# Patient Record
Sex: Male | Born: 1980 | Race: White | Hispanic: No | Marital: Single | Smoking: Never smoker
Health system: Southern US, Community
[De-identification: ages and names within clinical notes are randomized; demographics above are authoritative.]

## PROBLEM LIST (undated history)

## (undated) DIAGNOSIS — Z9221 Personal history of antineoplastic chemotherapy: Secondary | ICD-10-CM

## (undated) DIAGNOSIS — Z87898 Personal history of other specified conditions: Secondary | ICD-10-CM

## (undated) DIAGNOSIS — C419 Malignant neoplasm of bone and articular cartilage, unspecified: Secondary | ICD-10-CM

## (undated) HISTORY — PX: PORTACATH PLACEMENT: SHX2246

## (undated) HISTORY — DX: Personal history of other specified conditions: Z87.898

## (undated) HISTORY — PX: OTHER SURGICAL HISTORY: SHX169

## (undated) HISTORY — DX: Malignant neoplasm of bone and articular cartilage, unspecified: C41.9

## (undated) HISTORY — DX: Personal history of antineoplastic chemotherapy: Z92.21

## (undated) HISTORY — PX: KNEE SURGERY: SHX244

---

## 2004-04-19 ENCOUNTER — Ambulatory Visit: Payer: Self-pay | Admitting: Specialist

## 2005-01-22 ENCOUNTER — Ambulatory Visit: Payer: Self-pay | Admitting: Specialist

## 2006-07-14 ENCOUNTER — Ambulatory Visit: Payer: Self-pay | Admitting: General Practice

## 2006-08-24 ENCOUNTER — Ambulatory Visit: Payer: Self-pay | Admitting: Internal Medicine

## 2006-09-12 ENCOUNTER — Ambulatory Visit: Payer: Self-pay | Admitting: General Surgery

## 2006-09-15 ENCOUNTER — Ambulatory Visit: Payer: Self-pay | Admitting: Internal Medicine

## 2006-09-19 ENCOUNTER — Ambulatory Visit: Payer: Self-pay | Admitting: Internal Medicine

## 2006-09-23 ENCOUNTER — Ambulatory Visit: Payer: Self-pay | Admitting: Internal Medicine

## 2006-10-24 ENCOUNTER — Ambulatory Visit: Payer: Self-pay | Admitting: Internal Medicine

## 2006-11-02 ENCOUNTER — Other Ambulatory Visit: Payer: Self-pay

## 2006-11-02 ENCOUNTER — Emergency Department: Payer: Self-pay | Admitting: Emergency Medicine

## 2006-11-04 ENCOUNTER — Inpatient Hospital Stay: Payer: Self-pay | Admitting: Oncology

## 2006-11-11 ENCOUNTER — Inpatient Hospital Stay: Payer: Self-pay | Admitting: Internal Medicine

## 2006-11-24 ENCOUNTER — Ambulatory Visit: Payer: Self-pay | Admitting: Internal Medicine

## 2006-12-04 ENCOUNTER — Inpatient Hospital Stay: Payer: Self-pay | Admitting: Internal Medicine

## 2006-12-24 ENCOUNTER — Ambulatory Visit: Payer: Self-pay | Admitting: Internal Medicine

## 2006-12-26 ENCOUNTER — Inpatient Hospital Stay: Payer: Self-pay | Admitting: Internal Medicine

## 2007-01-24 ENCOUNTER — Ambulatory Visit: Payer: Self-pay | Admitting: Internal Medicine

## 2007-02-05 ENCOUNTER — Inpatient Hospital Stay: Payer: Self-pay | Admitting: Internal Medicine

## 2007-02-23 ENCOUNTER — Ambulatory Visit: Payer: Self-pay | Admitting: Internal Medicine

## 2007-02-27 ENCOUNTER — Inpatient Hospital Stay: Payer: Self-pay | Admitting: Internal Medicine

## 2007-03-19 ENCOUNTER — Inpatient Hospital Stay: Payer: Self-pay | Admitting: Internal Medicine

## 2007-03-26 ENCOUNTER — Ambulatory Visit: Payer: Self-pay | Admitting: Internal Medicine

## 2007-04-10 ENCOUNTER — Inpatient Hospital Stay: Payer: Self-pay | Admitting: Oncology

## 2007-04-18 ENCOUNTER — Observation Stay: Payer: Self-pay | Admitting: Internal Medicine

## 2007-04-26 ENCOUNTER — Ambulatory Visit: Payer: Self-pay | Admitting: Internal Medicine

## 2007-04-30 ENCOUNTER — Inpatient Hospital Stay: Payer: Self-pay | Admitting: Internal Medicine

## 2007-05-16 ENCOUNTER — Observation Stay: Payer: Self-pay | Admitting: Internal Medicine

## 2007-05-24 ENCOUNTER — Ambulatory Visit: Payer: Self-pay | Admitting: Internal Medicine

## 2007-05-26 ENCOUNTER — Inpatient Hospital Stay: Payer: Self-pay | Admitting: Internal Medicine

## 2007-06-15 ENCOUNTER — Inpatient Hospital Stay: Payer: Self-pay | Admitting: Internal Medicine

## 2007-06-24 ENCOUNTER — Ambulatory Visit: Payer: Self-pay | Admitting: Internal Medicine

## 2007-06-27 ENCOUNTER — Observation Stay: Payer: Self-pay | Admitting: Internal Medicine

## 2007-06-28 ENCOUNTER — Observation Stay: Payer: Self-pay | Admitting: Internal Medicine

## 2007-06-30 ENCOUNTER — Inpatient Hospital Stay: Payer: Self-pay | Admitting: Internal Medicine

## 2007-07-07 ENCOUNTER — Inpatient Hospital Stay: Payer: Self-pay | Admitting: Internal Medicine

## 2007-07-24 ENCOUNTER — Ambulatory Visit: Payer: Self-pay | Admitting: Internal Medicine

## 2007-07-27 ENCOUNTER — Inpatient Hospital Stay: Payer: Self-pay | Admitting: Internal Medicine

## 2007-08-08 ENCOUNTER — Observation Stay: Payer: Self-pay | Admitting: Internal Medicine

## 2007-08-18 ENCOUNTER — Inpatient Hospital Stay: Payer: Self-pay | Admitting: Internal Medicine

## 2007-08-24 ENCOUNTER — Ambulatory Visit: Payer: Self-pay | Admitting: Internal Medicine

## 2007-09-07 ENCOUNTER — Inpatient Hospital Stay: Payer: Self-pay | Admitting: Internal Medicine

## 2007-09-19 ENCOUNTER — Observation Stay: Payer: Self-pay | Admitting: Oncology

## 2007-09-23 ENCOUNTER — Ambulatory Visit: Payer: Self-pay | Admitting: Internal Medicine

## 2007-09-29 ENCOUNTER — Inpatient Hospital Stay: Payer: Self-pay | Admitting: Internal Medicine

## 2007-10-19 ENCOUNTER — Inpatient Hospital Stay: Payer: Self-pay | Admitting: Internal Medicine

## 2007-10-24 ENCOUNTER — Ambulatory Visit: Payer: Self-pay | Admitting: Internal Medicine

## 2007-10-31 ENCOUNTER — Observation Stay: Payer: Self-pay | Admitting: Oncology

## 2007-11-24 ENCOUNTER — Ambulatory Visit: Payer: Self-pay | Admitting: Internal Medicine

## 2007-12-15 ENCOUNTER — Ambulatory Visit: Payer: Self-pay | Admitting: Internal Medicine

## 2007-12-24 ENCOUNTER — Ambulatory Visit: Payer: Self-pay | Admitting: Internal Medicine

## 2008-01-24 ENCOUNTER — Ambulatory Visit: Payer: Self-pay | Admitting: Internal Medicine

## 2008-02-22 ENCOUNTER — Ambulatory Visit: Payer: Self-pay | Admitting: Internal Medicine

## 2008-02-23 ENCOUNTER — Ambulatory Visit: Payer: Self-pay | Admitting: Internal Medicine

## 2008-02-29 ENCOUNTER — Ambulatory Visit: Payer: Self-pay | Admitting: Internal Medicine

## 2008-03-12 ENCOUNTER — Emergency Department: Payer: Self-pay | Admitting: Emergency Medicine

## 2008-04-25 ENCOUNTER — Ambulatory Visit: Payer: Self-pay | Admitting: Internal Medicine

## 2008-05-12 ENCOUNTER — Ambulatory Visit: Payer: Self-pay | Admitting: Internal Medicine

## 2008-05-23 ENCOUNTER — Ambulatory Visit: Payer: Self-pay | Admitting: Internal Medicine

## 2008-06-23 ENCOUNTER — Ambulatory Visit: Payer: Self-pay | Admitting: Internal Medicine

## 2008-07-21 ENCOUNTER — Ambulatory Visit: Payer: Self-pay | Admitting: Internal Medicine

## 2008-07-23 ENCOUNTER — Ambulatory Visit: Payer: Self-pay | Admitting: Internal Medicine

## 2008-08-23 ENCOUNTER — Ambulatory Visit: Payer: Self-pay | Admitting: Internal Medicine

## 2008-09-20 ENCOUNTER — Ambulatory Visit: Payer: Self-pay | Admitting: Internal Medicine

## 2008-09-22 ENCOUNTER — Ambulatory Visit: Payer: Self-pay | Admitting: Internal Medicine

## 2008-11-23 ENCOUNTER — Ambulatory Visit: Payer: Self-pay | Admitting: Internal Medicine

## 2008-12-01 ENCOUNTER — Ambulatory Visit: Payer: Self-pay | Admitting: Internal Medicine

## 2008-12-23 ENCOUNTER — Ambulatory Visit: Payer: Self-pay | Admitting: Internal Medicine

## 2009-03-09 IMAGING — CR DG CHEST 2V
1 series · 2 of 2 positions shown · non-contrast
Comparison: none

REASON FOR EXAM: Dinorah Sarcoma
COMMENTS:

[Series 1: view not recorded · 0.17mm/px · 2 of 2 slices shown]
[im 1/2]
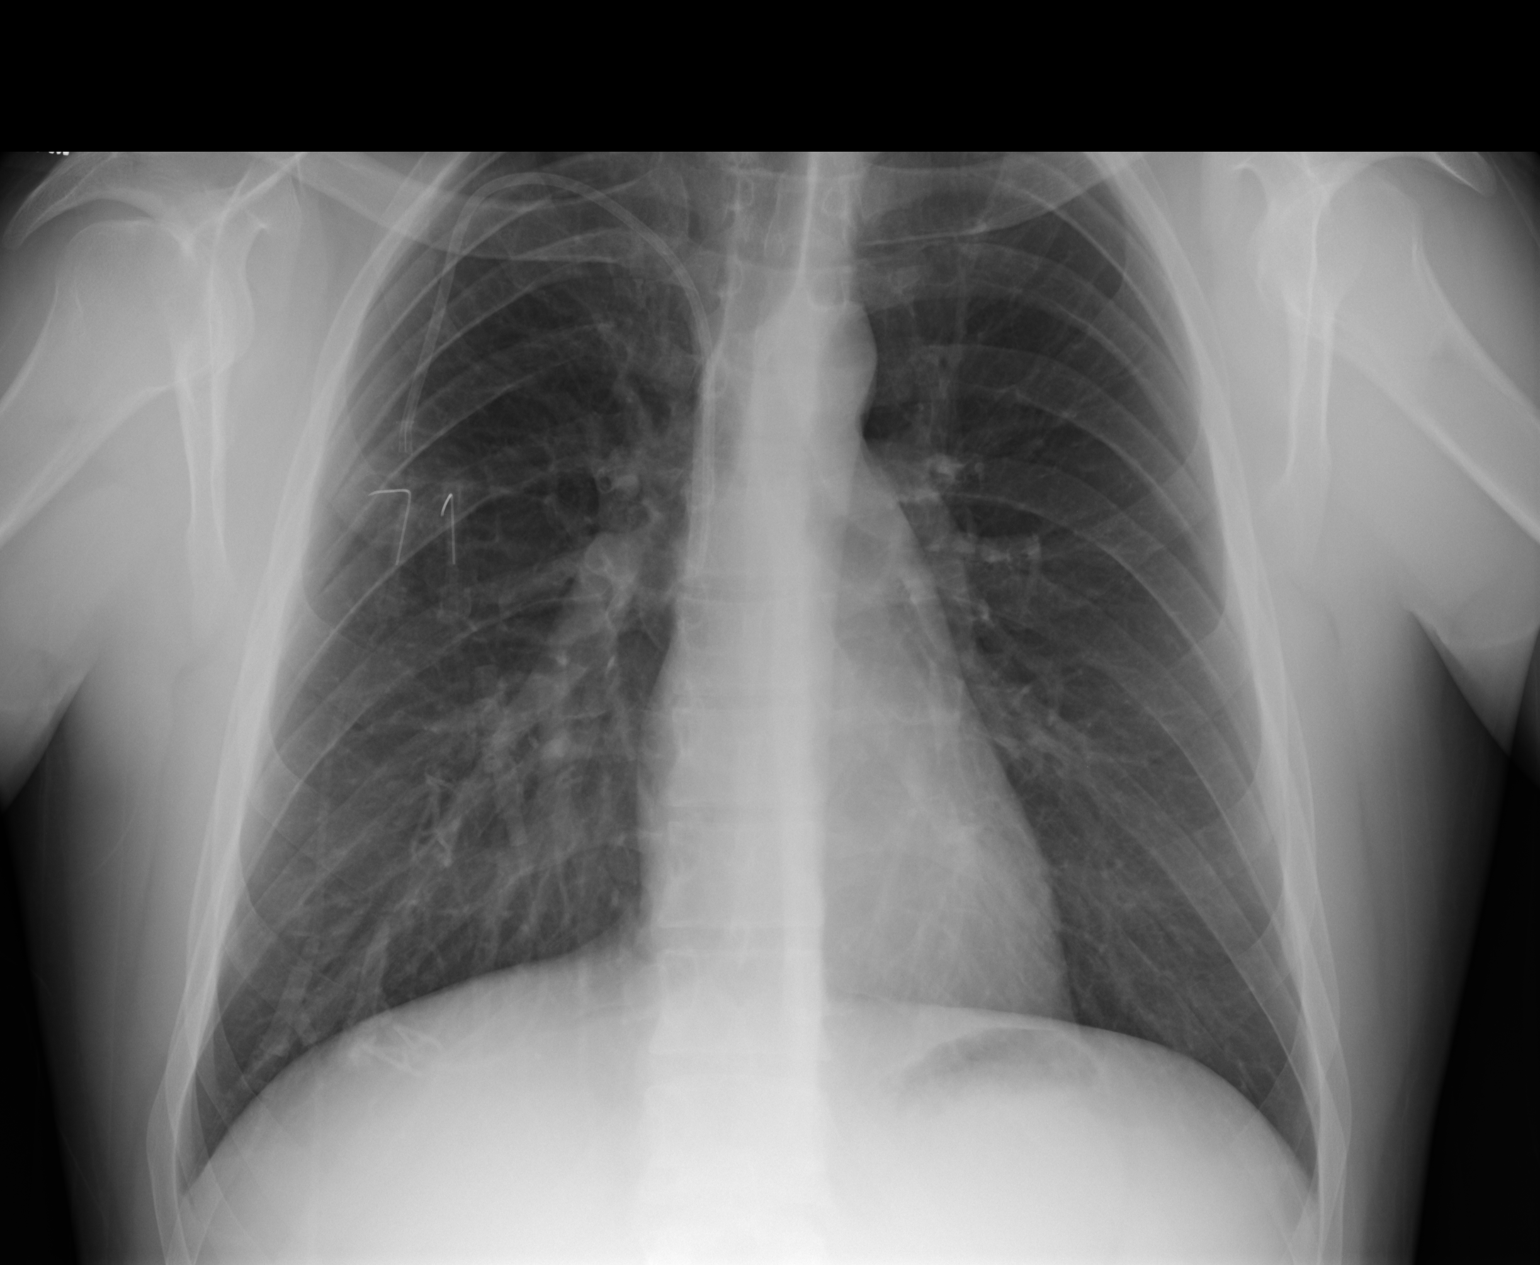
[im 2/2]
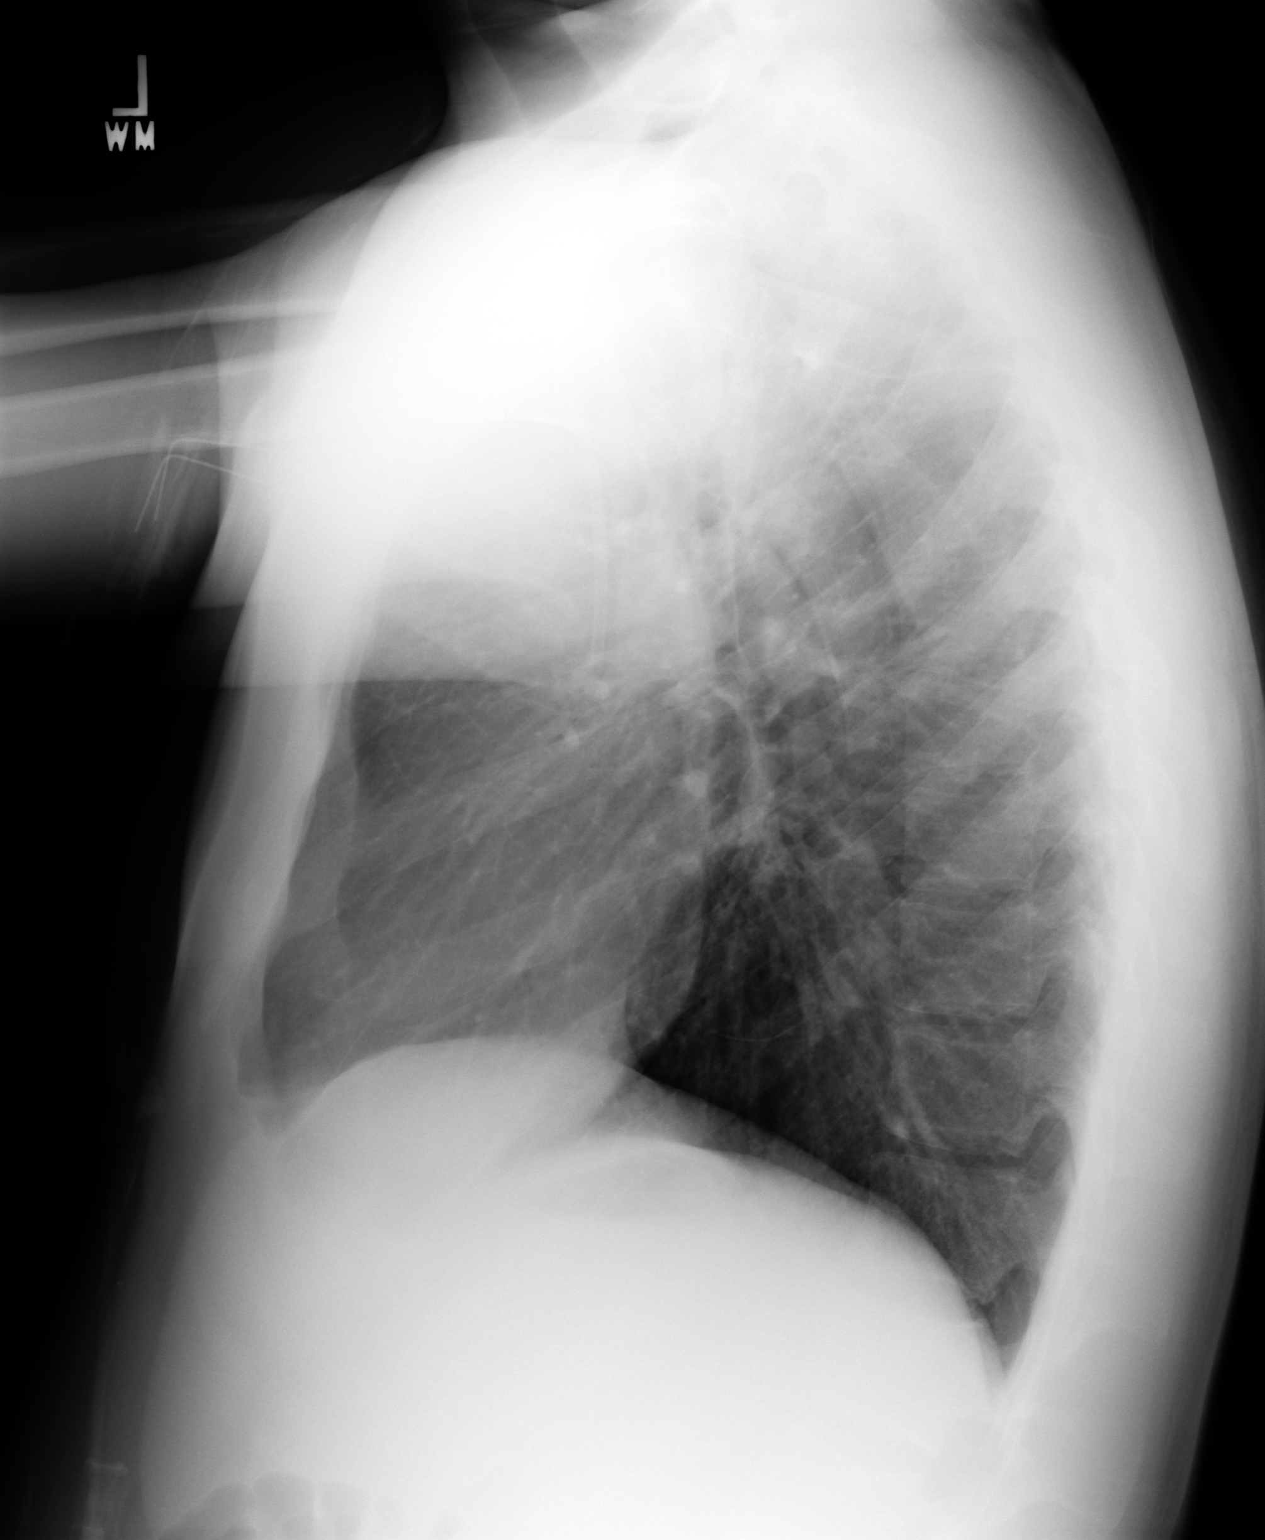

[2 of 2 positions shown; findings below may reference images not displayed]

PROCEDURE:     DXR - DXR CHEST PA (OR AP) AND LATERAL  - February 05, 2007  [DATE]

RESULT:     The lungs are mildly hyperinflated but clear. There is an
indwelling venous catheter in place with the tip of the catheter lying in
the region of the distal SVC. The heart and mediastinal structures are
normal in appearance.
IMPRESSION: I do not see findings suspicious for metastatic disease to the lungs. No
acute cardiopulmonary abnormality is identified.

## 2009-04-25 ENCOUNTER — Ambulatory Visit: Payer: Self-pay | Admitting: Internal Medicine

## 2009-05-12 ENCOUNTER — Ambulatory Visit: Payer: Self-pay | Admitting: Internal Medicine

## 2009-05-23 ENCOUNTER — Ambulatory Visit: Payer: Self-pay | Admitting: Internal Medicine

## 2009-06-23 ENCOUNTER — Ambulatory Visit: Payer: Self-pay | Admitting: Internal Medicine

## 2009-11-23 ENCOUNTER — Ambulatory Visit: Payer: Self-pay | Admitting: Internal Medicine

## 2009-12-14 ENCOUNTER — Ambulatory Visit: Payer: Self-pay | Admitting: Internal Medicine

## 2009-12-23 ENCOUNTER — Ambulatory Visit: Payer: Self-pay | Admitting: Internal Medicine

## 2010-06-20 ENCOUNTER — Ambulatory Visit: Payer: Self-pay | Admitting: Internal Medicine

## 2010-06-24 ENCOUNTER — Ambulatory Visit: Payer: Self-pay | Admitting: Internal Medicine

## 2010-07-24 ENCOUNTER — Ambulatory Visit: Payer: Self-pay | Admitting: Internal Medicine

## 2011-02-04 ENCOUNTER — Ambulatory Visit: Payer: Self-pay | Admitting: Internal Medicine

## 2011-02-23 ENCOUNTER — Ambulatory Visit: Payer: Self-pay | Admitting: Internal Medicine

## 2011-03-26 HISTORY — PX: TONSILLECTOMY: SUR1361

## 2011-08-07 ENCOUNTER — Ambulatory Visit: Payer: Self-pay | Admitting: Internal Medicine

## 2011-08-07 LAB — CBC CANCER CENTER
Basophil %: 0.5 %
Eosinophil %: 1.4 %
HCT: 45.6 % (ref 40.0–52.0)
HGB: 15.5 g/dL (ref 13.0–18.0)
Lymphocyte %: 29.3 %
MCH: 31.8 pg (ref 26.0–34.0)
Monocyte #: 0.3 x10 3/mm (ref 0.2–1.0)
Monocyte %: 10.3 %
Neutrophil %: 58.5 %
Platelet: 118 x10 3/mm — ABNORMAL LOW (ref 150–440)
RBC: 4.86 10*6/uL (ref 4.40–5.90)
RDW: 12.4 % (ref 11.5–14.5)

## 2011-08-07 LAB — CREATININE, SERUM: Creatinine: 1.33 mg/dL — ABNORMAL HIGH (ref 0.60–1.30)

## 2011-08-07 LAB — HEPATIC FUNCTION PANEL A (ARMC)
Albumin: 4.3 g/dL (ref 3.4–5.0)
Alkaline Phosphatase: 80 U/L (ref 50–136)
SGOT(AST): 27 U/L (ref 15–37)
SGPT (ALT): 29 U/L
Total Protein: 7.1 g/dL (ref 6.4–8.2)

## 2011-08-07 LAB — LACTATE DEHYDROGENASE: LDH: 127 U/L (ref 87–241)

## 2011-08-24 ENCOUNTER — Ambulatory Visit: Payer: Self-pay | Admitting: Internal Medicine

## 2012-03-06 ENCOUNTER — Ambulatory Visit: Payer: Self-pay | Admitting: Unknown Physician Specialty

## 2012-12-10 ENCOUNTER — Ambulatory Visit: Payer: Self-pay | Admitting: General Practice

## 2013-02-01 ENCOUNTER — Encounter: Payer: Self-pay | Admitting: *Deleted

## 2013-02-03 ENCOUNTER — Ambulatory Visit: Payer: Self-pay | Admitting: Internal Medicine

## 2013-02-03 LAB — COMPREHENSIVE METABOLIC PANEL
Albumin: 4.3 g/dL (ref 3.4–5.0)
Anion Gap: 10 (ref 7–16)
BUN: 17 mg/dL (ref 7–18)
Chloride: 103 mmol/L (ref 98–107)
Creatinine: 1.38 mg/dL — ABNORMAL HIGH (ref 0.60–1.30)
EGFR (African American): >60
Osmolality: 283 (ref 275–301)
SGOT(AST): 17 U/L (ref 15–37)
SGPT (ALT): 23 U/L (ref 12–78)
Sodium: 141 mmol/L (ref 136–145)
Total Protein: 6.8 g/dL (ref 6.4–8.2)

## 2013-02-03 LAB — CBC CANCER CENTER
Basophil #: 0 x10 3/mm (ref 0.0–0.1)
Basophil %: 0.5 %
Eosinophil #: 0.1 x10 3/mm (ref 0.0–0.7)
Eosinophil %: 1.2 %
HCT: 44.6 % (ref 40.0–52.0)
HGB: 15.3 g/dL (ref 13.0–18.0)
Lymphocyte #: 1.2 x10 3/mm (ref 1.0–3.6)
Lymphocyte %: 27.7 %
Monocyte %: 8.8 %
Neutrophil #: 2.7 x10 3/mm (ref 1.4–6.5)
Neutrophil %: 61.8 %
Platelet: 132 x10 3/mm — ABNORMAL LOW (ref 150–440)
RDW: 12.1 % (ref 11.5–14.5)

## 2013-02-22 ENCOUNTER — Ambulatory Visit: Payer: Self-pay | Admitting: Internal Medicine

## 2013-02-24 ENCOUNTER — Ambulatory Visit: Payer: Self-pay | Admitting: General Surgery

## 2013-02-24 DIAGNOSIS — K432 Incisional hernia without obstruction or gangrene: Secondary | ICD-10-CM | POA: Insufficient documentation

## 2013-04-25 HISTORY — PX: HERNIA REPAIR: SHX51

## 2013-05-10 DIAGNOSIS — Z85830 Personal history of malignant neoplasm of bone: Secondary | ICD-10-CM | POA: Insufficient documentation

## 2014-11-07 ENCOUNTER — Other Ambulatory Visit: Payer: Self-pay | Admitting: Family Medicine

## 2014-12-16 ENCOUNTER — Inpatient Hospital Stay: Payer: BC Managed Care – PPO | Attending: Internal Medicine | Admitting: Internal Medicine

## 2014-12-16 ENCOUNTER — Encounter: Payer: Self-pay | Admitting: *Deleted

## 2014-12-16 ENCOUNTER — Ambulatory Visit: Payer: Self-pay

## 2014-12-16 VITALS — BP 123/75 | HR 82 | Temp 97.8°F | Resp 18 | Ht 74.0 in | Wt 198.4 lb

## 2014-12-16 DIAGNOSIS — Z9221 Personal history of antineoplastic chemotherapy: Secondary | ICD-10-CM | POA: Insufficient documentation

## 2014-12-16 DIAGNOSIS — R911 Solitary pulmonary nodule: Secondary | ICD-10-CM | POA: Insufficient documentation

## 2014-12-16 DIAGNOSIS — R161 Splenomegaly, not elsewhere classified: Secondary | ICD-10-CM | POA: Insufficient documentation

## 2014-12-16 DIAGNOSIS — Z85830 Personal history of malignant neoplasm of bone: Secondary | ICD-10-CM | POA: Insufficient documentation

## 2014-12-16 DIAGNOSIS — C419 Malignant neoplasm of bone and articular cartilage, unspecified: Secondary | ICD-10-CM | POA: Insufficient documentation

## 2014-12-16 NOTE — Progress Notes (Signed)
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 I reviewed the patient's prior chart/pertinent labs/imaging in detail; findings are summarized.  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 Patient last follow-up in the clinic was approximately 2 years ago.  Patient denies any unusual shortness of breath or  Cough. Denies any unusual weight loss or unusual aches and pains.   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 He is retired Engineer, structural.   REVIEW OF SYSTEMS:  10 point review of system is done and is negative for mentioned above  No smoking; alcohol. Patient has 2 daughters. Patient Had sperm banking done prior to getting chemotherapy.   I have reviewed the past medical history, past surgical history, social history and family history with the patient and they are unchanged from previous note unless stated above.  ALLERGIES:  is allergic to penicillins; primaxin; vancomycin; and voltaren.  MEDICATIONS:  No current outpatient prescriptions on file.   No current facility-administered medications for this visit.    PHYSICAL EXAMINATION: ECOG PERFORMANCE STATUS: 0 - Asymptomatic  BP 123/75 mmHg  Pulse 82  Temp(Src) 97.8 F (36.6 C) (Tympanic)  Resp 18  Ht 6\' 2"  (1.88 m)  Wt 198 lb 6.6 oz (90 kg)  BMI 25.46 kg/m2  Filed Weights   12/16/14 1059  Weight: 198 lb 6.6 oz (90 kg)    GENERAL:alert, no distress and  comfortable. EYES: normal, Conjunctiva are pink and non-injected, sclera clear OROPHARYNX:no exudate, no erythema and lips, buccal mucosa, and tongue normal  NECK: No thyromegaly LYMPH:  no palpable lymphadenopathy in the cervical, axillary or inguinal LUNGS: clear to auscultation with normal breathing effort; No Wheeze or crackles  Cardio-vascular- Regular Rate & Rythm and no murmurs and no lower extremity edema ABDOMEN:abdomen soft, non-tender and normal bowel sounds; No hepato-splenomegaly.  Musculoskeletal:no cyanosis of digits and no clubbing  NEURO: alert & oriented x 3 with fluent speech, no focal motor/sensory deficits. SKIN: no skin rash   LABORATORY DATA:  I have reviewed the data as listed    Component Value Date/Time   NA 141 02/03/2013 1446   K 3.9 02/03/2013 1446   CL 103 02/03/2013 1446   CO2 28 02/03/2013 1446   GLUCOSE 106* 02/03/2013 1446   BUN 17 02/03/2013 1446   CREATININE 1.38* 02/03/2013 1446   CALCIUM 9.2 02/03/2013 1446   PROT 6.8 02/03/2013 1446   ALBUMIN 4.3 02/03/2013 1446   AST 17 02/03/2013 1446   ALT 23 02/03/2013 1446   ALKPHOS 80 02/03/2013 1446   BILITOT 0.7 02/03/2013 1446   GFRNONAA >60 02/03/2013 1446   GFRAA >60 02/03/2013 1446    No results found for: SPEP, UPEP  Lab Results  Component Value Date   WBC 4.4 02/03/2013   NEUTROABS 2.7 02/03/2013   HGB 15.3 02/03/2013   HCT 44.6 02/03/2013   MCV 93 02/03/2013   PLT 132* 02/03/2013  Chemistry      Component Value Date/Time   NA 141 02/03/2013 1446   K 3.9 02/03/2013 1446   CL 103 02/03/2013 1446   CO2 28 02/03/2013 1446   BUN 17 02/03/2013 1446   CREATININE 1.38* 02/03/2013 1446      Component Value Date/Time   CALCIUM 9.2 02/03/2013 1446   ALKPHOS 80 02/03/2013 1446   AST 17 02/03/2013 1446   ALT 23 02/03/2013 1446   BILITOT 0.7 02/03/2013 1446       ASSESSMENT & PLAN:   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 Monitor on a yearly basis.   # history of lung nodules- multiple surveillance CT scans; CAT scan in 2014 stable. Thought to be benign.  # history of mild splenomegaly-asymptomatic.  Recommend follow-up on annual basis. He states to have labs done through his PCP.   All questions were answered. The patient knows to call the clinic with any problems, questions or concerns. No barriers to learning was detected. I spent 15 minutes counseling the patient face to face. The total time spent in the appointment was 30 minutes and more than 50% was on counseling and review of test results     Cammie Sickle, MD 12/16/2014 11:56 AM

## 2014-12-16 NOTE — Progress Notes (Signed)
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 He denies any complaints. He has not seen an oncologist at the [facility] since 2014 and he desires to reestablish care. He denies taking any medications or supplements.

## 2015-04-28 ENCOUNTER — Telehealth: Payer: Self-pay | Admitting: *Deleted

## 2015-04-28 ENCOUNTER — Encounter: Payer: Self-pay | Admitting: *Deleted

## 2015-04-28 NOTE — Telephone Encounter (Signed)
Patient would like a letter stating he can return to work. Please call pt when ready.

## 2015-04-28 NOTE — Telephone Encounter (Signed)
Letter is ready for patient. Letter placed in the mail per patient request.

## 2015-10-23 ENCOUNTER — Telehealth: Payer: Self-pay

## 2015-10-23 NOTE — Telephone Encounter (Signed)
Patient had found a new lump under right arm his arm.  Is sore and he does not have follow up appointment until 12/18/2015.  Patient forwarded to appointment desk to try and get in sooner.

## 2015-10-24 ENCOUNTER — Inpatient Hospital Stay: Payer: BC Managed Care – PPO | Attending: Internal Medicine | Admitting: Internal Medicine

## 2015-10-24 VITALS — BP 113/68 | HR 75 | Temp 97.4°F | Resp 18 | Wt 192.2 lb

## 2015-10-24 DIAGNOSIS — R918 Other nonspecific abnormal finding of lung field: Secondary | ICD-10-CM | POA: Insufficient documentation

## 2015-10-24 DIAGNOSIS — R161 Splenomegaly, not elsewhere classified: Secondary | ICD-10-CM | POA: Diagnosis not present

## 2015-10-24 DIAGNOSIS — L723 Sebaceous cyst: Secondary | ICD-10-CM | POA: Diagnosis not present

## 2015-10-24 DIAGNOSIS — C419 Malignant neoplasm of bone and articular cartilage, unspecified: Secondary | ICD-10-CM

## 2015-10-24 DIAGNOSIS — M79672 Pain in left foot: Secondary | ICD-10-CM | POA: Diagnosis not present

## 2015-10-24 DIAGNOSIS — Z8589 Personal history of malignant neoplasm of other organs and systems: Secondary | ICD-10-CM | POA: Insufficient documentation

## 2015-10-24 DIAGNOSIS — M79671 Pain in right foot: Secondary | ICD-10-CM | POA: Diagnosis not present

## 2015-10-24 DIAGNOSIS — Z9221 Personal history of antineoplastic chemotherapy: Secondary | ICD-10-CM | POA: Diagnosis not present

## 2015-10-24 MED ORDER — CEPHALEXIN 500 MG PO CAPS: 500.0000 mg | ORAL_CAPSULE | Freq: Three times a day (TID) | ORAL | 0 refills | Status: DC

## 2015-10-24 NOTE — Assessment & Plan Note (Signed)
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 Monitor on a yearly basis.   # Sebaceous cyst/ infected- right underarm; recommend Keflex for 10 days. If not improving to call us; or recurrent- then recommend surgical evaluation for excision.  # history of lung nodules- multiple surveillance CT scans; CAT scan in 2014 stable. Thought to be benign.  # history of mild splenomegaly-asymptomatic  # Recommend keep appointment as planned in September.

## 2015-10-24 NOTE — Progress Notes (Signed)
Patient here today as follow up regarding ewing sarcoma.  Patient noticed a knot under his right arm.  Also has had problems with his feet.  Recently at the beach and noticed bruising on the bottom of his right foot.  Also has a rash on his left foot to the outside.

## 2015-10-24 NOTE — Progress Notes (Signed)
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  Patient states he was in the beach recently and noted to have pain in his bilateral feet- which he attributes from being on his feet.  He also noted to have a small "knot" in his right underarm. It is painful. No discharge.   Patient denies any unusual shortness of breath or  Cough. Denies any unusual weight loss or unusual aches and pains.   REVIEW OF SYSTEMS:  10 point review of system is done and is negative for mentioned above  No smoking; alcohol. Patient has 2 daughters.    I have reviewed the past medical history, past surgical history, social history and family history with the patient and they are unchanged from previous note unless stated above.  ALLERGIES:  is allergic to penicillins; primaxin [imipenem]; vancomycin; and voltaren [diclofenac sodium].  MEDICATIONS:  Current Outpatient Prescriptions  Medication Sig Dispense Refill  . cephALEXin (KEFLEX) 500 MG capsule Take 1 capsule (500 mg total) by mouth 3 (three) times daily. 30 capsule 0   No current facility-administered medications for this visit.     PHYSICAL EXAMINATION: ECOG PERFORMANCE STATUS: 0 - Asymptomatic  BP 113/68 (BP Location: Left Arm, Patient Position: Sitting)   Pulse 75   Temp 97.4 F (36.3 C) (Tympanic)   Resp 18   Wt 192 lb 3.9 oz (87.2 kg)   BMI 24.68 kg/m   Filed Weights    10/24/15 1149  Weight: 192 lb 3.9 oz (87.2 kg)    GENERAL:alert, no distress and comfortable. EYES: normal, Conjunctiva are pink and non-injected, sclera clear OROPHARYNX:no exudate, no erythema and lips, buccal mucosa, and tongue normal  NECK: No thyromegaly LYMPH:  no palpable lymphadenopathy in the cervical, axillary or inguinal LUNGS: clear to auscultation with normal breathing effort; No Wheeze or crackles  Cardio-vascular- Regular Rate & Rythm and no murmurs and no lower extremity edema ABDOMEN:abdomen soft, non-tender and normal bowel sounds; No hepato-splenomegaly.  Musculoskeletal:no cyanosis of digits and no clubbing  NEURO: alert & oriented x 3 with fluent speech, no focal motor/sensory deficits. SKIN:  1cm sized cyst in the right underarm; tender to touch. No drainage.  LABORATORY DATA:  I have reviewed the data as listed    Component Value Date/Time   NA 141 02/03/2013 1446   K 3.9 02/03/2013 1446   CL 103 02/03/2013 1446   CO2 28 02/03/2013 1446   GLUCOSE 106 (H) 02/03/2013 1446   BUN 17 02/03/2013 1446   CREATININE 1.38 (H) 02/03/2013 1446   CALCIUM 9.2 02/03/2013 1446   PROT 6.8 02/03/2013 1446   ALBUMIN 4.3 02/03/2013 1446   AST 17 02/03/2013 1446   ALT 23 02/03/2013 1446   ALKPHOS 80 02/03/2013 1446   BILITOT 0.7 02/03/2013 1446   GFRNONAA >60 02/03/2013 1446   GFRAA >60 02/03/2013 1446    No  results found for: SPEP, UPEP  Lab Results  Component Value Date   WBC 4.4 02/03/2013   NEUTROABS 2.7 02/03/2013   HGB 15.3 02/03/2013   HCT 44.6 02/03/2013   MCV 93 02/03/2013   PLT 132 (L) 02/03/2013      Chemistry      Component Value Date/Time   NA 141 02/03/2013 1446   K 3.9 02/03/2013 1446   CL 103 02/03/2013 1446   CO2 28 02/03/2013 1446   BUN 17 02/03/2013 1446   CREATININE 1.38 (H) 02/03/2013 1446      Component Value Date/Time   CALCIUM 9.2 02/03/2013 1446   ALKPHOS 80 02/03/2013 1446   AST 17 02/03/2013 1446   ALT 23 02/03/2013 1446    BILITOT 0.7 02/03/2013 1446       ASSESSMENT & PLAN:   Ewing sarcoma 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 Monitor on a yearly basis.   # Sebaceous cyst/ infected- right underarm; recommend Keflex for 10 days. If not improving to call us; or recurrent- then recommend surgical evaluation for excision.  # history of lung nodules- multiple surveillance CT scans; CAT scan in 2014 stable. Thought to be benign.  # history of mild splenomegaly-asymptomatic  # Recommend keep appointment as planned in September.     Cammie Sickle, MD 10/24/2015 1:18 PM

## 2015-11-06 ENCOUNTER — Telehealth: Payer: Self-pay | Admitting: *Deleted

## 2015-11-06 NOTE — Telephone Encounter (Signed)
Per Dr Rogue Bussing, patient to see PCP and perhaps be referred to surgeon as he feels this is a sebaceous cyst. Left VM for patient

## 2015-11-06 NOTE — Telephone Encounter (Signed)
Called to report that the lump under his arm has not completely resolved with antibiotics and is asking what he is to do now, Please advise

## 2015-12-18 ENCOUNTER — Inpatient Hospital Stay: Payer: BC Managed Care – PPO | Attending: Internal Medicine | Admitting: Internal Medicine

## 2015-12-18 VITALS — BP 111/72 | HR 75 | Temp 97.0°F | Resp 20 | Ht 74.0 in | Wt 194.0 lb

## 2015-12-18 DIAGNOSIS — R161 Splenomegaly, not elsewhere classified: Secondary | ICD-10-CM | POA: Diagnosis not present

## 2015-12-18 DIAGNOSIS — R911 Solitary pulmonary nodule: Secondary | ICD-10-CM

## 2015-12-18 DIAGNOSIS — Z85830 Personal history of malignant neoplasm of bone: Secondary | ICD-10-CM | POA: Diagnosis not present

## 2015-12-18 DIAGNOSIS — C419 Malignant neoplasm of bone and articular cartilage, unspecified: Secondary | ICD-10-CM

## 2015-12-18 NOTE — Assessment & Plan Note (Addendum)
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   # Sebaceous cyst/ infected- right underarm;s/p antibiotics- improved/resolved.   # history of lung nodules- multiple surveillance CT scans; CAT scan in 2014 stable.   # history of mild splenomegaly-asymptomatic  # Recommend keep appointment as planned in September 2018/labs.

## 2015-12-18 NOTE — Progress Notes (Signed)
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  Status post antibiotics his "knot " right  underarm has improved. He is also followed up with his PCP.   Patient denies any unusual shortness of breath or  Cough. Denies any unusual weight loss or unusual aches and pains.   REVIEW OF SYSTEMS:  10 point review of system is done and is negative for mentioned above  No smoking; alcohol. Patient has 2 daughters.    I have reviewed the past medical history, past surgical history, social history and family history with the patient and they are unchanged from previous note unless stated above.  ALLERGIES:  is allergic to penicillins; primaxin [imipenem]; vancomycin; and voltaren [diclofenac sodium].  MEDICATIONS:  No current outpatient prescriptions on file.   No current facility-administered medications for this visit.     PHYSICAL EXAMINATION: ECOG PERFORMANCE STATUS: 0 - Asymptomatic  BP 111/72 (BP Location: Right Arm, Patient Position: Sitting)   Pulse 75   Temp 97 F (36.1 C) (Tympanic)   Resp 20   Ht 6\' 2"  (1.88 m)   Wt 194 lb (88 kg)   BMI 24.91 kg/m   Filed Weights   12/18/15 1015  Weight: 194 lb (88 kg)    GENERAL:alert, no distress and comfortable. EYES: normal, Conjunctiva are pink and non-injected, sclera clear OROPHARYNX:no exudate, no erythema and lips, buccal mucosa, and tongue normal  NECK:  No thyromegaly LYMPH:  no palpable lymphadenopathy in the cervical, axillary or inguinal LUNGS: clear to auscultation with normal breathing effort; No Wheeze or crackles  Cardio-vascular- Regular Rate & Rythm and no murmurs and no lower extremity edema ABDOMEN:abdomen soft, non-tender and normal bowel sounds; No hepato-splenomegaly.  Musculoskeletal:no cyanosis of digits and no clubbing  NEURO: alert & oriented x 3 with fluent speech, no focal motor/sensory deficits. SKIN:  No rashes or bumps.  LABORATORY DATA:  I have reviewed the data as listed    Component Value Date/Time   NA 141 02/03/2013 1446   K 3.9 02/03/2013 1446   CL 103 02/03/2013 1446   CO2 28 02/03/2013 1446   GLUCOSE 106 (H) 02/03/2013 1446   BUN 17 02/03/2013 1446   CREATININE 1.38 (H) 02/03/2013 1446   CALCIUM 9.2 02/03/2013 1446   PROT 6.8 02/03/2013 1446   ALBUMIN 4.3 02/03/2013 1446   AST 17 02/03/2013 1446   ALT 23 02/03/2013 1446   ALKPHOS 80 02/03/2013 1446   BILITOT 0.7 02/03/2013 1446   GFRNONAA >60 02/03/2013 1446   GFRAA >60 02/03/2013 1446    No results found for: SPEP, UPEP  Lab Results  Component Value Date   WBC 4.4 02/03/2013   NEUTROABS 2.7 02/03/2013   HGB 15.3 02/03/2013   HCT 44.6 02/03/2013   MCV 93 02/03/2013   PLT 132 (L) 02/03/2013      Chemistry  Component Value Date/Time   NA 141 02/03/2013 1446   K 3.9 02/03/2013 1446   CL 103 02/03/2013 1446   CO2 28 02/03/2013 1446   BUN 17 02/03/2013 1446   CREATININE 1.38 (H) 02/03/2013 1446      Component Value Date/Time   CALCIUM 9.2 02/03/2013 1446   ALKPHOS 80 02/03/2013 1446   AST 17 02/03/2013 1446   ALT 23 02/03/2013 1446   BILITOT 0.7 02/03/2013 1446       ASSESSMENT & PLAN:   Ewing sarcoma ([place]) 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   # Sebaceous cyst/ infected- right underarm;s/p antibiotics- improved/resolved.    # history of lung nodules- multiple surveillance CT scans; CAT scan in 2014 stable.   # history of mild splenomegaly-asymptomatic  # Recommend keep appointment as planned in September 2018/labs.      Cammie Sickle, MD 12/19/2015 8:42 AM

## 2015-12-20 DIAGNOSIS — R918 Other nonspecific abnormal finding of lung field: Secondary | ICD-10-CM | POA: Insufficient documentation

## 2015-12-20 DIAGNOSIS — Z87898 Personal history of other specified conditions: Secondary | ICD-10-CM | POA: Insufficient documentation

## 2016-04-08 ENCOUNTER — Encounter: Payer: Self-pay | Admitting: *Deleted

## 2016-04-11 ENCOUNTER — Encounter: Payer: Self-pay | Admitting: Family Medicine

## 2016-12-18 ENCOUNTER — Ambulatory Visit: Payer: BC Managed Care – PPO | Admitting: Internal Medicine

## 2016-12-18 ENCOUNTER — Other Ambulatory Visit: Payer: BC Managed Care – PPO

## 2017-02-20 ENCOUNTER — Inpatient Hospital Stay: Payer: BC Managed Care – PPO | Attending: Internal Medicine

## 2017-02-20 ENCOUNTER — Encounter: Payer: Self-pay | Admitting: Internal Medicine

## 2017-02-20 ENCOUNTER — Other Ambulatory Visit: Payer: Self-pay

## 2017-02-20 ENCOUNTER — Inpatient Hospital Stay (HOSPITAL_BASED_OUTPATIENT_CLINIC_OR_DEPARTMENT_OTHER): Payer: BC Managed Care – PPO | Admitting: Internal Medicine

## 2017-02-20 VITALS — BP 126/74 | HR 76 | Temp 97.2°F | Resp 20 | Ht 74.0 in | Wt 196.0 lb

## 2017-02-20 DIAGNOSIS — C419 Malignant neoplasm of bone and articular cartilage, unspecified: Secondary | ICD-10-CM

## 2017-02-20 DIAGNOSIS — Z881 Allergy status to other antibiotic agents status: Secondary | ICD-10-CM | POA: Insufficient documentation

## 2017-02-20 DIAGNOSIS — Z880 Allergy status to penicillin: Secondary | ICD-10-CM | POA: Insufficient documentation

## 2017-02-20 DIAGNOSIS — R918 Other nonspecific abnormal finding of lung field: Secondary | ICD-10-CM

## 2017-02-20 DIAGNOSIS — R161 Splenomegaly, not elsewhere classified: Secondary | ICD-10-CM

## 2017-02-20 DIAGNOSIS — Z85830 Personal history of malignant neoplasm of bone: Secondary | ICD-10-CM

## 2017-02-20 DIAGNOSIS — Z9221 Personal history of antineoplastic chemotherapy: Secondary | ICD-10-CM | POA: Diagnosis not present

## 2017-02-20 LAB — COMPREHENSIVE METABOLIC PANEL
ALBUMIN: 4.5 g/dL (ref 3.5–5.0)
ALK PHOS: 70 U/L (ref 38–126)
ALT: 23 U/L (ref 17–63)
ANION GAP: 7 (ref 5–15)
AST: 26 U/L (ref 15–41)
BILIRUBIN TOTAL: 0.9 mg/dL (ref 0.3–1.2)
BUN: 18 mg/dL (ref 6–20)
CO2: 29 mmol/L (ref 22–32)
CREATININE: 1.35 mg/dL — AB (ref 0.61–1.24)
Calcium: 9.5 mg/dL (ref 8.9–10.3)
Chloride: 102 mmol/L (ref 101–111)
GFR calc Af Amer: >60 mL/min (ref >60)
GFR calc non Af Amer: >60 mL/min (ref >60)
Glucose, Bld: 108 mg/dL — ABNORMAL HIGH (ref 65–99)
Potassium: 3.9 mmol/L (ref 3.5–5.1)
SODIUM: 138 mmol/L (ref 135–145)
Total Protein: 6.6 g/dL (ref 6.5–8.1)

## 2017-02-20 LAB — CBC WITH DIFFERENTIAL/PLATELET
BASOS PCT: 1 %
Basophils Absolute: 0 10*3/uL (ref 0–0.1)
Eosinophils Absolute: 0.1 10*3/uL (ref 0–0.7)
Eosinophils Relative: 2 %
HCT: 41.1 % (ref 40.0–52.0)
HEMOGLOBIN: 14.5 g/dL (ref 13.0–18.0)
LYMPHS ABS: 1.2 10*3/uL (ref 1.0–3.6)
Lymphocytes Relative: 29 %
MCH: 32.3 pg (ref 26.0–34.0)
MCHC: 35.3 g/dL (ref 32.0–36.0)
MCV: 91.5 fL (ref 80.0–100.0)
MONOS PCT: 9 %
Monocytes Absolute: 0.4 10*3/uL (ref 0.2–1.0)
NEUTROS ABS: 2.5 10*3/uL (ref 1.4–6.5)
NEUTROS PCT: 59 %
Platelets: 160 10*3/uL (ref 150–440)
RBC: 4.49 MIL/uL (ref 4.40–5.90)
RDW: 12.5 % (ref 11.5–14.5)
WBC: 4.2 10*3/uL (ref 3.8–10.6)

## 2017-02-20 NOTE — Assessment & Plan Note (Addendum)
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   # history of lung nodules- multiple surveillance CT scans; CAT scan in 2014 stable.  No further surveillance recommended.  # history of mild splenomegaly-asymptomatic  # pt will call for appts for September 2019/labs.

## 2017-02-20 NOTE — Progress Notes (Signed)
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  Patient denies any unusual shortness of breath or  Cough. Denies any unusual weight loss or unusual aches and pains.  Denies any headaches or vision changes.  REVIEW OF SYSTEMS:  10 point review of system is done and is negative for mentioned above.   No smoking; alcohol. Patient has 2 daughters.    I have reviewed the past medical history, past surgical history, social history and family history with the patient and they are unchanged from previous note unless stated above.  ALLERGIES:  is allergic to penicillins; primaxin [imipenem]; vancomycin; and voltaren [diclofenac sodium].  MEDICATIONS:  No current outpatient medications on file.   No current facility-administered medications for this visit.     PHYSICAL EXAMINATION: ECOG PERFORMANCE STATUS: 0 - Asymptomatic  BP 126/74 (BP Location: Left Arm, Patient Position: Sitting)   Pulse 76   Temp (!) 97.2 F (36.2 C) (Tympanic)   Resp 20   Ht 6\' 2"  (1.88 m)   Wt 196 lb (88.9 kg)   BMI 25.16 kg/m   Filed Weights   02/20/17 1333  Weight: 196 lb (88.9 kg)    GENERAL:alert, no distress and comfortable. EYES: normal, Conjunctiva are pink and non-injected, sclera clear OROPHARYNX:no exudate, no erythema and lips, buccal mucosa, and tongue normal  NECK: No thyromegaly LYMPH:  no palpable lymphadenopathy in  the cervical, axillary or inguinal LUNGS: clear to auscultation with normal breathing effort; No Wheeze or crackles  Cardio-vascular- Regular Rate & Rythm and no murmurs and no lower extremity edema ABDOMEN:abdomen soft, non-tender and normal bowel sounds; No hepato-splenomegaly.  Musculoskeletal:no cyanosis of digits and no clubbing  NEURO: alert & oriented x 3 with fluent speech, no focal motor/sensory deficits. SKIN:  No rashes or bumps.  LABORATORY DATA:  I have reviewed the data as listed    Component Value Date/Time   NA 138 02/20/2017 1313   NA 141 02/03/2013 1446   K 3.9 02/20/2017 1313   K 3.9 02/03/2013 1446   CL 102 02/20/2017 1313   CL 103 02/03/2013 1446   CO2 29 02/20/2017 1313   CO2 28 02/03/2013 1446   GLUCOSE 108 (H) 02/20/2017 1313   GLUCOSE 106 (H) 02/03/2013 1446   BUN 18 02/20/2017 1313   BUN 17 02/03/2013 1446   CREATININE 1.35 (H) 02/20/2017 1313   CREATININE 1.38 (H) 02/03/2013 1446   CALCIUM 9.5 02/20/2017 1313   CALCIUM 9.2 02/03/2013 1446   PROT 6.6 02/20/2017 1313   PROT 6.8 02/03/2013 1446   ALBUMIN 4.5 02/20/2017 1313   ALBUMIN 4.3 02/03/2013 1446   AST 26 02/20/2017 1313   AST 17 02/03/2013 1446   ALT 23 02/20/2017 1313   ALT 23 02/03/2013 1446   ALKPHOS 70 02/20/2017 1313   ALKPHOS 80 02/03/2013 1446   BILITOT 0.9 02/20/2017 1313  BILITOT 0.7 02/03/2013 1446   GFRNONAA >60 02/20/2017 1313   GFRNONAA >60 02/03/2013 1446   GFRAA >60 02/20/2017 1313   GFRAA >60 02/03/2013 1446    No results found for: SPEP, UPEP  Lab Results  Component Value Date   WBC 4.2 02/20/2017   NEUTROABS 2.5 02/20/2017   HGB 14.5 02/20/2017   HCT 41.1 02/20/2017   MCV 91.5 02/20/2017   PLT 160 02/20/2017      Chemistry      Component Value Date/Time   NA 138 02/20/2017 1313   NA 141 02/03/2013 1446   K 3.9 02/20/2017 1313   K 3.9 02/03/2013 1446   CL 102 02/20/2017 1313   CL 103 02/03/2013 1446   CO2 29 02/20/2017 1313   CO2 28 02/03/2013 1446    BUN 18 02/20/2017 1313   BUN 17 02/03/2013 1446   CREATININE 1.35 (H) 02/20/2017 1313   CREATININE 1.38 (H) 02/03/2013 1446      Component Value Date/Time   CALCIUM 9.5 02/20/2017 1313   CALCIUM 9.2 02/03/2013 1446   ALKPHOS 70 02/20/2017 1313   ALKPHOS 80 02/03/2013 1446   AST 26 02/20/2017 1313   AST 17 02/03/2013 1446   ALT 23 02/20/2017 1313   ALT 23 02/03/2013 1446   BILITOT 0.9 02/20/2017 1313   BILITOT 0.7 02/03/2013 1446       ASSESSMENT & PLAN:   Ewing sarcoma ([place]) 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   # Sebaceous cyst/ infected- right underarm;s/p antibiotics- improved/resolved.   # history of lung nodules- multiple surveillance CT scans; CAT scan in 2014 stable.   # history of mild splenomegaly-asymptomatic  # pt will cal for appts for September 2019/labs.      Cammie Sickle, MD 02/20/2017 6:17 PM

## 2017-03-13 ENCOUNTER — Encounter: Payer: Self-pay | Admitting: *Deleted

## 2017-03-21 ENCOUNTER — Telehealth: Payer: Self-pay | Admitting: *Deleted

## 2017-03-21 NOTE — Telephone Encounter (Signed)
Patient called to inquire on status of his letter for life insurance. I informed pt that the letter is ready to be picked up.

## 2017-04-07 ENCOUNTER — Telehealth: Payer: Self-pay | Admitting: *Deleted

## 2017-04-07 NOTE — Telephone Encounter (Signed)
Spoke with patient regarding his concerns for life insurance policy. Per pt-he submitted the additional letter to the life insurance group. The representative of the company commented that pt's life insurance claim may be denied given the fact that his pulmonary nodule has only been 'stable' since 2012. Patient states that Dr. Inez Pilgrim had told him that the nodule had been on his imaging since 2008. He was originally dx with pulmonary nodules at the time of diagnosis and he believe that this imaging may have been performed at [facility]. I explained to him that we did a through chart review on his electronic records within [facility] and the records that were scanned in under the [facility] system. The scan report dated in 06/2010 was the first mention of any pulmonary nodules. I explained that we also reviewed Dr. Marylene Land notes in the system as well for additional documentation. I will be happy to pull the hardcopy chart from archives to see if this information was noted on the outside records. Pt asked that we hold off doing that at this time. He has not had an official denial of the application. He will let us know if anything further is needed.

## 2017-06-05 ENCOUNTER — Ambulatory Visit: Payer: BC Managed Care – PPO | Admitting: Urology

## 2017-06-05 ENCOUNTER — Encounter: Payer: Self-pay | Admitting: Urology

## 2017-06-05 VITALS — BP 133/83 | HR 105 | Resp 16 | Ht 74.0 in | Wt 198.8 lb

## 2017-06-05 DIAGNOSIS — R21 Rash and other nonspecific skin eruption: Secondary | ICD-10-CM | POA: Diagnosis not present

## 2017-06-05 DIAGNOSIS — Z8619 Personal history of other infectious and parasitic diseases: Secondary | ICD-10-CM

## 2017-06-05 MED ORDER — BETAMETHASONE DIPROPIONATE 0.05 % EX CREA: TOPICAL_CREAM | Freq: Two times a day (BID) | CUTANEOUS | 0 refills | Status: AC

## 2017-06-05 MED ORDER — VALACYCLOVIR HCL 1 G PO TABS: 1000.0000 mg | ORAL_TABLET | Freq: Two times a day (BID) | ORAL | 0 refills | Status: AC

## 2017-06-05 NOTE — Progress Notes (Signed)
06/05/2017 12:36 PM   Herbert Mcmillan 1980/10/29 [id number]  Referring provider: Donivan Scull, PA-C [address] [place], [place] [address]  No chief complaint on file.   HPI: Patient is a 37 year old Caucasian male who is referred to [place] by Dr. Corinda Gubler for painful, penile lesions.  He states about 2 weeks ago he developed a sinus infection.  He was seen at a [facility] and given a Z pak.  His symptoms worsen and then he was placed on doxycycline, eardrops and a nasal spray.    It was during that time when the penile pain began.  He states he was laying in bed trying to fall asleep and then it felt like somebody flipped a light switch or hit the tip of his penis with a match.  He states the pain began to worsen to the point where he cannot stand any contact to the head of the penis.  He cannot shower at this time due to the severity of the pain.    A day or so later, the skin on the head of the penis opened up and was ulcerated looking.  He states that he and his wife did not engage in any sexual activity until their marriage.  He states he and his wife have never had other sexual partners.  HSV testing was negative at his PCP's office.    He was given an cream to apply, Lotrisone, but it has not helped.    He has base line frequency, incontinence and intermittency.  Patient denies any gross hematuria, dysuria or suprapubic/flank pain.  Patient denies any fevers, chills, nausea or vomiting.     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    PMH: Past Medical History:  Diagnosis Date  . 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 History of chemotherapy   . History of solitary pulmonary nodule     Surgical History: Past Surgical History:  Procedure Laterality Date  . HERNIA REPAIR  04/2013  . KNEE SURGERY    . PORTACATH PLACEMENT    . portacath removal    . TONSILLECTOMY  2013    Home Medications:  Allergies as of 06/05/2017    Reactions   Penicillins Hives   Primaxin [imipenem]    Vancomycin    Voltaren [diclofenac Sodium] Rash      Medication List        Accurate as of 06/05/17 11:59 PM. Always use your most recent med list.          betamethasone dipropionate 0.05 % cream Commonly known as:  DIPROLENE Apply topically 2 (two) times daily.   clotrimazole-betamethasone cream Commonly known as:  LOTRISONE   doxycycline 100 MG capsule Commonly known as:  VIBRAMYCIN   valACYclovir 1000 MG tablet Commonly known as:  VALTREX Take 1 tablet (1,000 mg total) by mouth 2 (two) times daily.       Allergies:  Allergies  Allergen Reactions  . Penicillins Hives  . Primaxin [Imipenem]   . Vancomycin   . Voltaren [Diclofenac Sodium] Rash    Family History: Family History  Problem Relation Age of Onset  . Prostate cancer Neg Hx   . Bladder Cancer Neg Hx   . Kidney cancer Neg Hx     Social History:  reports that  has never smoked. he has never used smokeless tobacco. He reports that he does not drink alcohol or use drugs.  ROS: UROLOGY Frequent  Urination?: Yes Hard to postpone urination?: No Burning/pain with urination?: No Get up at night to urinate?: No Leakage of urine?: Yes Urine stream starts and stops?: Yes Trouble starting stream?: No Do you have to strain to urinate?: No Blood in urine?: No Urinary tract infection?: No Sexually transmitted disease?: No Injury to kidneys or bladder?: No Painful intercourse?: No Weak stream?: No Erection problems?: No Penile pain?: Yes  Gastrointestinal Nausea?: No Vomiting?: No Indigestion/heartburn?: No Diarrhea?: No Constipation?: No  Constitutional Fever: No Night sweats?: No Weight loss?: No Fatigue?: No  Skin Skin rash/lesions?: No Itching?: No  Eyes Blurred vision?: No Double vision?: No  Ears/Nose/Throat Sore throat?: No Sinus problems?: Yes  Hematologic/Lymphatic Swollen glands?: No Easy bruising?:  No  Cardiovascular Leg swelling?: No Chest pain?: No  Respiratory Cough?: No Shortness of breath?: No  Endocrine Excessive thirst?: No  Musculoskeletal Back pain?: No Joint pain?: No  Neurological Headaches?: No Dizziness?: No  Psychologic Depression?: No Anxiety?: No  Physical Exam: BP 133/83   Pulse (!) 105   Resp 16   Ht 6\' 2"  (1.88 m)   Wt 198 lb 12.8 oz (90.2 kg)   SpO2 98%   BMI 25.52 kg/m   Constitutional: Well nourished. Alert and oriented, No acute distress. HEENT: [place] AT, moist mucus membranes. Trachea midline, no masses. Cardiovascular: No clubbing, cyanosis, or edema. Respiratory: Normal respiratory effort, no increased work of breathing. GI: Abdomen is soft, non tender, non distended, no abdominal masses. Liver and spleen not palpable.  No hernias appreciated.  Stool sample for occult testing is not indicated.   GU: No CVA tenderness.  No bladder fullness or masses.  Patient with circumcised phallus.  Urethral meatus is patent.  No penile discharge.  Erythematous crescent shaped tender lesion located at the right dorsal side of the glands measuring 1 cm x 5 mm a second smaller lesion but the same characteristics on the left ventral side of the glands 5 mm x 3 mm in size.  They are both flat.  No ulcerations seen at this time.  Scrotum without lesions, cysts, rashes and/or edema.  Testicles are located scrotally bilaterally. No masses are appreciated in the testicles. Left and right epididymis are normal. Skin: No rashes, bruises or suspicious lesions. Lymph: No cervical or inguinal adenopathy. Neurologic: Grossly intact, no focal deficits, moving all 4 extremities. Psychiatric: Normal mood and affect.  Laboratory Data: Lab Results  Component Value Date   WBC 4.2 02/20/2017   HGB 14.5 02/20/2017   HCT 41.1 02/20/2017   MCV 91.5 02/20/2017   PLT 160 02/20/2017    Lab Results  Component Value Date   CREATININE 1.35 (H) 02/20/2017    No results  found for: PSA  No results found for: TESTOSTERONE  No results found for: HGBA1C  No results found for: TSH  No results found for: CHOL, HDL, CHOLHDL, VLDL, LDLCALC  Lab Results  Component Value Date   AST 26 02/20/2017   Lab Results  Component Value Date   ALT 23 02/20/2017   No components found for: ALKALINEPHOPHATASE No components found for: BILIRUBINTOTAL  No results found for: ESTRADIOL  Urinalysis No results found for: COLORURINE, APPEARANCEUR, LABSPEC, PHURINE, GLUCOSEU, HGBUR, BILIRUBINUR, KETONESUR, PROTEINUR, UROBILINOGEN, NITRITE, LEUKOCYTESUR  I have reviewed the labs.   Assessment & Plan:    1. History of shingles Question of the possibility of a outbreak of shingles on the penis as the pain did begin suddenly and prior to rash Given prescription for Valtrex  Ambulatory referral to Dermatology  2. Penile rash Given a script for Diprolene to apply bid  Referral to dermatology is pending   Return for refer to dermatology .  These notes generated with voice recognition software. I apologize for typographical errors.  Zara Council, [facility] [address] [place], [place] [address] [phone]

## 2017-06-09 ENCOUNTER — Telehealth: Payer: Self-pay | Admitting: Urology

## 2017-06-09 NOTE — Telephone Encounter (Signed)
Patient called and said that he made his own app for the dermatologist but they need the referral, I can't fax until the notes are closed. His app is 06-12-17  Thanks, Sharyn Lull

## 2017-06-09 NOTE — Telephone Encounter (Signed)
Note is done.  Is he feeling any better?

## 2017-06-11 NOTE — Telephone Encounter (Signed)
Spoke with pt in reference to his s/s. Pt stated that he is NOT any better and is almost feeling worse. Pt stated that he was able to get an appt with his mothers dermatologist yesterday who dx pt with fixed drug eruption. Pt stated that dermatologist told him that he was a reaction to the doxy he took for an ear infection. Pt stated that dermatologist advised him to stop valtrex as it was 100% not shingles but stay on cream. Refilled cream for pt. Pt requested doxy be added to his allergy list as dermatology made him aware should he take medication again s/s would return and 10x worse. Made pt aware medication was added to allergies list.

## 2017-06-13 ENCOUNTER — Other Ambulatory Visit: Payer: Self-pay

## 2017-06-13 MED ORDER — CLOTRIMAZOLE-BETAMETHASONE 1-0.05 % EX CREA: TOPICAL_CREAM | Freq: Two times a day (BID) | CUTANEOUS | 3 refills | Status: AC

## 2020-02-28 ENCOUNTER — Emergency Department
Admission: EM | Admit: 2020-02-28 | Discharge: 2020-02-28 | Discharge status: Against Medical Advice | Payer: BC Managed Care – PPO

## 2021-01-29 ENCOUNTER — Other Ambulatory Visit: Payer: Self-pay

## 2021-01-29 NOTE — Progress Notes (Signed)
Pt completed pre-employment uds. CL,RMA

## 2021-02-27 ENCOUNTER — Other Ambulatory Visit: Payer: Self-pay

## 2021-02-27 ENCOUNTER — Encounter: Payer: Self-pay | Admitting: Physician Assistant

## 2021-02-27 ENCOUNTER — Ambulatory Visit: Payer: Self-pay | Admitting: Physician Assistant

## 2021-02-27 VITALS — BP 120/76 | HR 88 | Temp 97.5°F | Resp 12 | Ht 74.0 in | Wt 207.0 lb

## 2021-02-27 DIAGNOSIS — Z0184 Encounter for antibody response examination: Secondary | ICD-10-CM

## 2021-02-27 DIAGNOSIS — Z021 Encounter for pre-employment examination: Secondary | ICD-10-CM

## 2021-02-27 NOTE — Progress Notes (Signed)
   Subjective: Employment physical    Patient ID: Herbert Mcmillan, male    DOB: 07-Aug-1980, 40 y.o.   MRN: [id number]  HPI Patient presents for employment physical.  Voices no concerns or complaints.   Review of Systems History of a Ewing Sarcoma    Objective:   Physical Exam  No acute distress.  Temperature 97.5, pulse 88, respiration 12, BP is 120/76, and patient 97% O2 sat on room air.  Patient weighs 27 pounds and BMI is 26.58. HEENT is unremarkable.  Neck is supple without lymphadenectomy or bruits.  Lungs are clear to auscultation.  Heart is regular rate and rhythm. Abdomen with negative HSM, normoactive bowel sounds, soft, nontender to palpation. No obvious deformity to the upper or lower extremities.  Patient has full and equal range of motion of the upper and lower extremities. No obvious cervical or lumbar spine deformity.  Patient has full and equal range of motion of the cervical lumbar spine. Cranial nerves II through XII are grossly intact.  DTR 2+ without clonus.      Assessment & Plan: Well exam.   Patient cleared for employment.

## 2021-03-02 LAB — QUANTIFERON-TB GOLD PLUS
QuantiFERON Mitogen Value: >10 IU/mL
QuantiFERON Nil Value: 0.02 IU/mL
QuantiFERON TB1 Ag Value: 0.01 IU/mL
QuantiFERON TB2 Ag Value: 0.01 IU/mL
QuantiFERON-TB Gold Plus: NEGATIVE

## 2022-05-17 ENCOUNTER — Other Ambulatory Visit: Payer: Self-pay

## 2022-05-17 ENCOUNTER — Other Ambulatory Visit: Payer: Self-pay | Admitting: Physician Assistant

## 2022-05-17 DIAGNOSIS — Z1152 Encounter for screening for COVID-19: Secondary | ICD-10-CM

## 2022-05-17 LAB — POC COVID19 BINAXNOW: SARS Coronavirus 2 Ag: NEGATIVE

## 2022-05-17 LAB — POCT INFLUENZA A/B
Influenza A, POC: NEGATIVE
Influenza B, POC: NEGATIVE

## 2022-05-17 LAB — POCT RAPID STREP A (OFFICE): Rapid Strep A Screen: NEGATIVE

## 2022-05-17 MED ORDER — SULFAMETHOXAZOLE-TRIMETHOPRIM 800-160 MG PO TABS: 1.0000 | ORAL_TABLET | Freq: Two times a day (BID) | ORAL | 0 refills | Status: AC

## 2022-05-17 MED ORDER — FEXOFENADINE-PSEUDOEPHED ER 60-120 MG PO TB12: 1.0000 | ORAL_TABLET | Freq: Two times a day (BID) | ORAL | 0 refills | Status: AC

## 2022-05-17 MED ORDER — LIDOCAINE VISCOUS HCL 2 % MT SOLN: 5.0000 mL | Freq: Four times a day (QID) | OROMUCOSAL | 0 refills | Status: AC | PRN

## 2022-05-17 NOTE — Progress Notes (Signed)
sinus pressure left side,& teeth painx 1 week. woke up with sore throat and neck pain this morning. Negative for covid, flu, and strep.
